# Patient Record
Sex: Male | Born: 1935 | Race: White | Hispanic: No | Smoking: Never smoker
Health system: Southern US, Community
[De-identification: ages and names within clinical notes are randomized; demographics above are authoritative.]

## PROBLEM LIST (undated history)

## (undated) DIAGNOSIS — F329 Major depressive disorder, single episode, unspecified: Secondary | ICD-10-CM

## (undated) DIAGNOSIS — G20 Parkinson's disease: Secondary | ICD-10-CM

## (undated) DIAGNOSIS — F419 Anxiety disorder, unspecified: Secondary | ICD-10-CM

## (undated) DIAGNOSIS — G309 Alzheimer's disease, unspecified: Secondary | ICD-10-CM

## (undated) DIAGNOSIS — Z8546 Personal history of malignant neoplasm of prostate: Secondary | ICD-10-CM

## (undated) DIAGNOSIS — I10 Essential (primary) hypertension: Secondary | ICD-10-CM

## (undated) DIAGNOSIS — I479 Paroxysmal tachycardia, unspecified: Secondary | ICD-10-CM

## (undated) DIAGNOSIS — C801 Malignant (primary) neoplasm, unspecified: Secondary | ICD-10-CM

## (undated) DIAGNOSIS — D649 Anemia, unspecified: Secondary | ICD-10-CM

## (undated) DIAGNOSIS — N400 Enlarged prostate without lower urinary tract symptoms: Secondary | ICD-10-CM

## (undated) DIAGNOSIS — E46 Unspecified protein-calorie malnutrition: Secondary | ICD-10-CM

## (undated) DIAGNOSIS — K219 Gastro-esophageal reflux disease without esophagitis: Secondary | ICD-10-CM

## (undated) DIAGNOSIS — M1990 Unspecified osteoarthritis, unspecified site: Secondary | ICD-10-CM

## (undated) DIAGNOSIS — N182 Chronic kidney disease, stage 2 (mild): Secondary | ICD-10-CM

## (undated) DIAGNOSIS — I739 Peripheral vascular disease, unspecified: Secondary | ICD-10-CM

## (undated) DIAGNOSIS — G20A1 Parkinson's disease without dyskinesia, without mention of fluctuations: Secondary | ICD-10-CM

## (undated) DIAGNOSIS — F32A Depression, unspecified: Secondary | ICD-10-CM

## (undated) DIAGNOSIS — D6489 Other specified anemias: Secondary | ICD-10-CM

## (undated) DIAGNOSIS — E559 Vitamin D deficiency, unspecified: Secondary | ICD-10-CM

## (undated) DIAGNOSIS — M159 Polyosteoarthritis, unspecified: Secondary | ICD-10-CM

## (undated) DIAGNOSIS — F0280 Dementia in other diseases classified elsewhere without behavioral disturbance: Secondary | ICD-10-CM

## (undated) HISTORY — DX: Alzheimer's disease, unspecified: G30.9

## (undated) HISTORY — DX: Vitamin D deficiency, unspecified: E55.9

## (undated) HISTORY — DX: Essential (primary) hypertension: I10

## (undated) HISTORY — DX: Unspecified protein-calorie malnutrition: E46

## (undated) HISTORY — DX: Depression, unspecified: F32.A

## (undated) HISTORY — DX: Major depressive disorder, single episode, unspecified: F32.9

## (undated) HISTORY — DX: Parkinson's disease: G20

## (undated) HISTORY — DX: Chronic kidney disease, stage 2 (mild): N18.2

## (undated) HISTORY — DX: Paroxysmal tachycardia, unspecified: I47.9

## (undated) HISTORY — DX: Polyosteoarthritis, unspecified: M15.9

## (undated) HISTORY — DX: Personal history of malignant neoplasm of prostate: Z85.46

## (undated) HISTORY — DX: Parkinson's disease without dyskinesia, without mention of fluctuations: G20.A1

## (undated) HISTORY — DX: Anxiety disorder, unspecified: F41.9

## (undated) HISTORY — DX: Anemia, unspecified: D64.9

## (undated) HISTORY — DX: Dementia in other diseases classified elsewhere, unspecified severity, without behavioral disturbance, psychotic disturbance, mood disturbance, and anxiety: F02.80

## (undated) HISTORY — DX: Peripheral vascular disease, unspecified: I73.9

## (undated) HISTORY — PX: ROTATOR CUFF REPAIR: SHX139

## (undated) HISTORY — PX: TOTAL KNEE ARTHROPLASTY: SHX125

## (undated) HISTORY — DX: Other specified anemias: D64.89

## (undated) HISTORY — DX: Benign prostatic hyperplasia without lower urinary tract symptoms: N40.0

## (undated) HISTORY — PX: HERNIA REPAIR: SHX51

## (undated) HISTORY — DX: Gastro-esophageal reflux disease without esophagitis: K21.9

## (undated) HISTORY — DX: Unspecified osteoarthritis, unspecified site: M19.90

## (undated) HISTORY — DX: Malignant (primary) neoplasm, unspecified: C80.1

---

## 2003-08-03 ENCOUNTER — Other Ambulatory Visit: Payer: Self-pay

## 2008-05-04 ENCOUNTER — Inpatient Hospital Stay: Payer: Self-pay | Admitting: Internal Medicine

## 2008-05-09 ENCOUNTER — Emergency Department: Payer: Self-pay | Admitting: Internal Medicine

## 2009-03-16 ENCOUNTER — Ambulatory Visit: Payer: Self-pay | Admitting: Internal Medicine

## 2009-04-02 ENCOUNTER — Inpatient Hospital Stay: Payer: Self-pay | Admitting: Specialist

## 2009-04-16 ENCOUNTER — Ambulatory Visit: Payer: Self-pay | Admitting: Internal Medicine

## 2009-10-15 ENCOUNTER — Encounter: Admission: RE | Admit: 2009-10-15 | Discharge: 2009-10-15 | Payer: Self-pay | Admitting: Neurology

## 2010-02-13 ENCOUNTER — Emergency Department: Payer: Self-pay | Admitting: Emergency Medicine

## 2010-09-24 IMAGING — CR DXR PORTABLE CHEST SINGLE VIEW
1 series · 1 of 1 positions shown · non-contrast
Comparison: none

REASON FOR EXAM: intubation
COMMENTS:

PROCEDURE:     DXR - DXR PORTABLE CHEST SINGLE VIEW  - April 04, 2009  [DATE]
RESULT:     Mild bibasilar atelectasis is present. Atelectatic changes
and/or infiltrate in the right lung base has cleared. There is slight
increase in markings in the left lung base from prior exam of 04/03/2009.

[view not recorded]
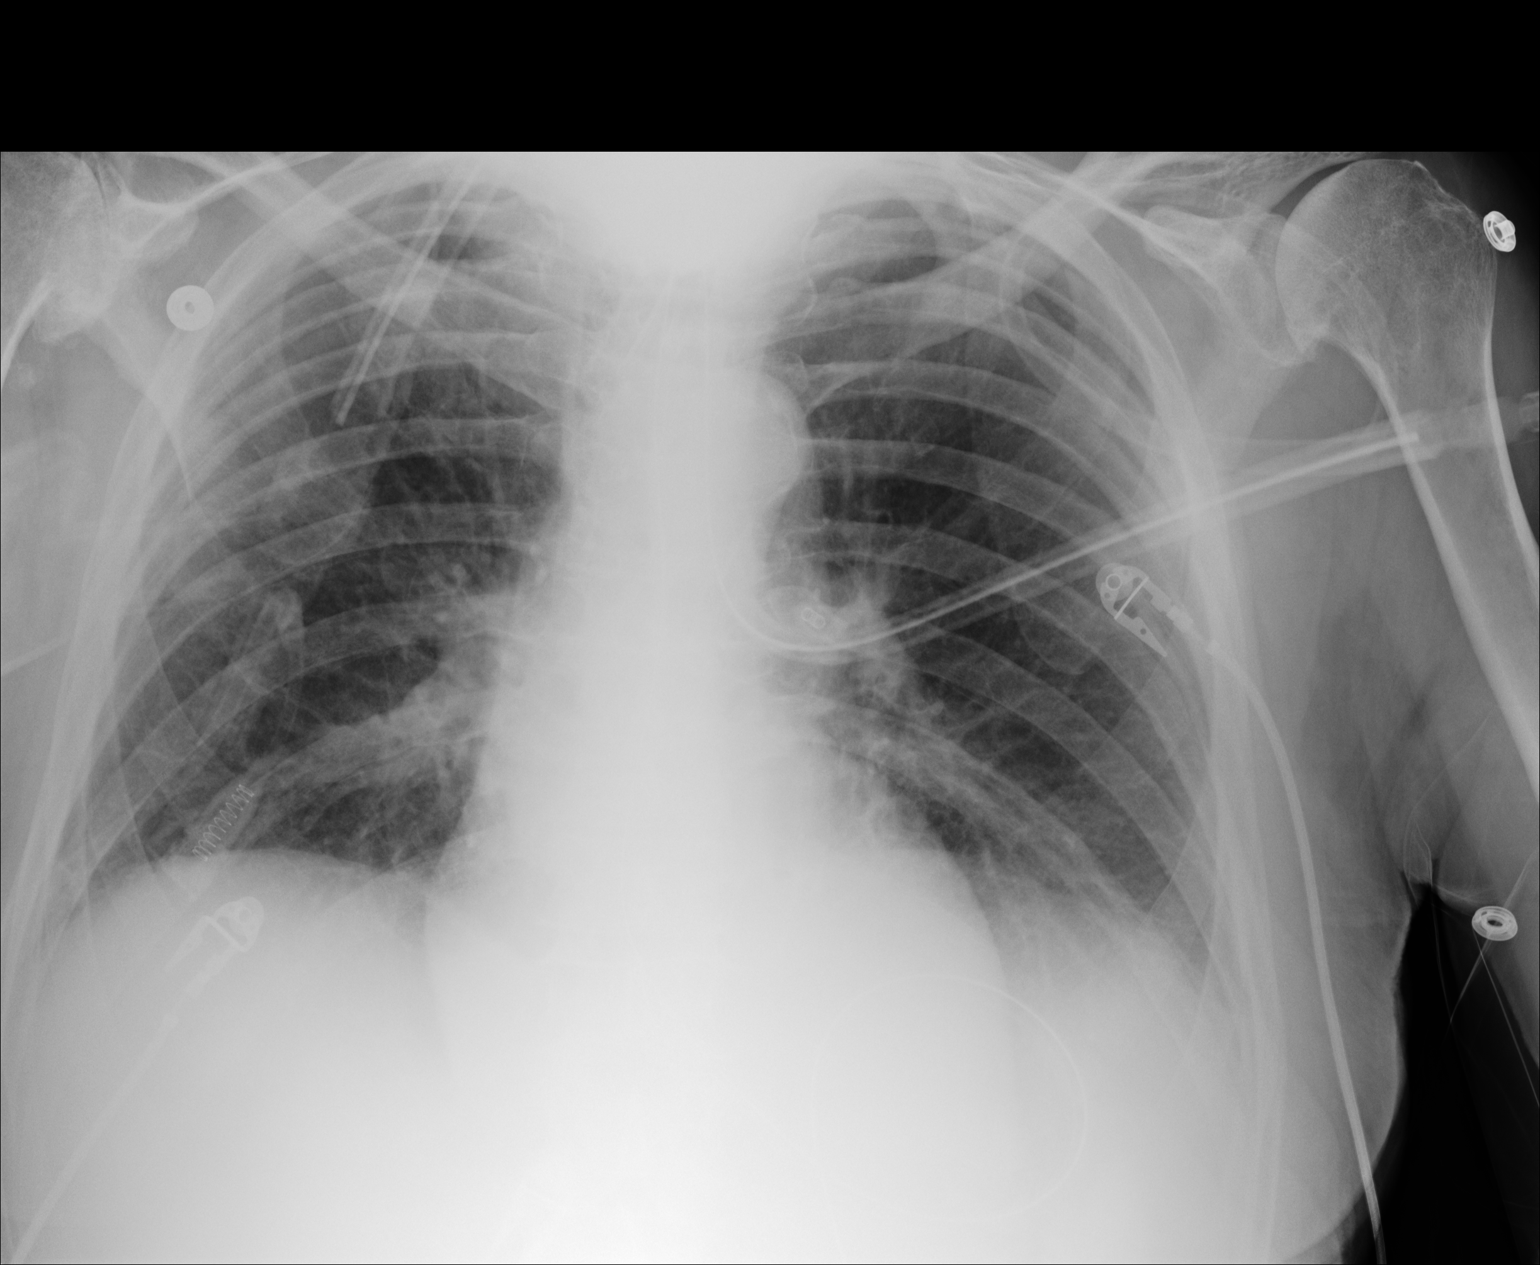

[1 of 1 positions shown; findings below may reference images not displayed]

IMPRESSION: Slight improvement of atelectasis and/or infiltrate in the right lung base.
Mild increase lung markings in the left lung base. This could represent
atelectasis and/or pneumonia. Continued followup chest x-ray is suggested.

## 2011-06-04 ENCOUNTER — Emergency Department: Payer: Self-pay | Admitting: Emergency Medicine

## 2012-06-17 DIAGNOSIS — F411 Generalized anxiety disorder: Secondary | ICD-10-CM

## 2012-06-17 DIAGNOSIS — L0390 Cellulitis, unspecified: Secondary | ICD-10-CM

## 2012-06-17 DIAGNOSIS — Z9181 History of falling: Secondary | ICD-10-CM

## 2012-06-17 DIAGNOSIS — M159 Polyosteoarthritis, unspecified: Secondary | ICD-10-CM

## 2012-06-17 DIAGNOSIS — G309 Alzheimer's disease, unspecified: Secondary | ICD-10-CM

## 2012-06-17 DIAGNOSIS — L0291 Cutaneous abscess, unspecified: Secondary | ICD-10-CM

## 2012-06-17 DIAGNOSIS — F0280 Dementia in other diseases classified elsewhere without behavioral disturbance: Secondary | ICD-10-CM

## 2012-06-17 DIAGNOSIS — H02049 Spastic entropion of unspecified eye, unspecified eyelid: Secondary | ICD-10-CM

## 2012-06-20 ENCOUNTER — Other Ambulatory Visit: Payer: Self-pay | Admitting: *Deleted

## 2012-06-20 MED ORDER — ALPRAZOLAM 0.5 MG PO TBDP: ORAL_TABLET | ORAL | Status: DC

## 2012-06-20 MED ORDER — LORAZEPAM 0.5 MG PO TABS: ORAL_TABLET | ORAL | Status: DC

## 2012-06-21 ENCOUNTER — Other Ambulatory Visit: Payer: Self-pay | Admitting: *Deleted

## 2012-06-21 MED ORDER — ALPRAZOLAM 0.5 MG PO TABS: ORAL_TABLET | ORAL | Status: DC

## 2012-07-04 ENCOUNTER — Other Ambulatory Visit: Payer: Self-pay | Admitting: *Deleted

## 2012-07-04 MED ORDER — ALPRAZOLAM 0.5 MG PO TBDP: ORAL_TABLET | ORAL | Status: DC

## 2012-07-12 ENCOUNTER — Other Ambulatory Visit: Payer: Self-pay | Admitting: *Deleted

## 2012-07-12 MED ORDER — DIAZEPAM 5 MG PO TABS: ORAL_TABLET | ORAL | Status: DC

## 2012-07-15 DIAGNOSIS — E46 Unspecified protein-calorie malnutrition: Secondary | ICD-10-CM

## 2012-07-15 DIAGNOSIS — L03818 Cellulitis of other sites: Secondary | ICD-10-CM

## 2012-07-15 DIAGNOSIS — M6289 Other specified disorders of muscle: Secondary | ICD-10-CM

## 2012-07-15 DIAGNOSIS — I872 Venous insufficiency (chronic) (peripheral): Secondary | ICD-10-CM

## 2012-07-15 DIAGNOSIS — L02818 Cutaneous abscess of other sites: Secondary | ICD-10-CM

## 2012-07-15 DIAGNOSIS — H02049 Spastic entropion of unspecified eye, unspecified eyelid: Secondary | ICD-10-CM

## 2012-07-25 ENCOUNTER — Other Ambulatory Visit: Payer: Self-pay | Admitting: *Deleted

## 2012-07-25 MED ORDER — ALPRAZOLAM 0.5 MG PO TABS: ORAL_TABLET | ORAL | Status: DC

## 2012-08-18 DIAGNOSIS — F339 Major depressive disorder, recurrent, unspecified: Secondary | ICD-10-CM

## 2012-08-18 DIAGNOSIS — G20 Parkinson's disease: Secondary | ICD-10-CM

## 2012-08-18 DIAGNOSIS — E46 Unspecified protein-calorie malnutrition: Secondary | ICD-10-CM

## 2012-09-15 ENCOUNTER — Other Ambulatory Visit: Payer: Self-pay | Admitting: Geriatric Medicine

## 2012-09-15 MED ORDER — ALPRAZOLAM 0.5 MG PO TABS: ORAL_TABLET | ORAL | Status: DC

## 2012-09-22 ENCOUNTER — Encounter: Payer: Self-pay | Admitting: *Deleted

## 2012-10-05 ENCOUNTER — Other Ambulatory Visit: Payer: Self-pay | Admitting: Geriatric Medicine

## 2012-10-05 MED ORDER — ALPRAZOLAM 0.5 MG PO TABS: ORAL_TABLET | ORAL | Status: DC

## 2012-10-06 ENCOUNTER — Other Ambulatory Visit: Payer: Self-pay | Admitting: *Deleted

## 2012-10-06 MED ORDER — ALPRAZOLAM 0.5 MG PO TABS: ORAL_TABLET | ORAL | Status: DC

## 2012-10-07 ENCOUNTER — Non-Acute Institutional Stay (SKILLED_NURSING_FACILITY): Payer: PRIVATE HEALTH INSURANCE | Admitting: Internal Medicine

## 2012-10-07 DIAGNOSIS — F0280 Dementia in other diseases classified elsewhere without behavioral disturbance: Secondary | ICD-10-CM

## 2012-10-07 DIAGNOSIS — F411 Generalized anxiety disorder: Secondary | ICD-10-CM

## 2012-10-07 DIAGNOSIS — G309 Alzheimer's disease, unspecified: Secondary | ICD-10-CM

## 2012-10-07 DIAGNOSIS — F32A Depression, unspecified: Secondary | ICD-10-CM

## 2012-10-07 DIAGNOSIS — K219 Gastro-esophageal reflux disease without esophagitis: Secondary | ICD-10-CM

## 2012-10-07 DIAGNOSIS — F419 Anxiety disorder, unspecified: Secondary | ICD-10-CM | POA: Insufficient documentation

## 2012-10-07 DIAGNOSIS — F329 Major depressive disorder, single episode, unspecified: Secondary | ICD-10-CM

## 2012-10-07 DIAGNOSIS — I10 Essential (primary) hypertension: Secondary | ICD-10-CM

## 2012-10-07 DIAGNOSIS — K560 Paralytic ileus: Secondary | ICD-10-CM

## 2012-10-07 NOTE — Progress Notes (Signed)
Patient ID: Jake Armstrong, male   DOB: Nov 03, 1922, 79 y.o.   MRN: [id number]  Code Status: DNR  Allergies  Allergen Reactions  . Finasteride   . Hytrin (Terazosin)   . Tagamet (Cimetidine)    Chief Complaint  Patient presents with  . Medical Managment of Chronic Issues    HPI:  79 Y/o male patient seen today for routine follow up. He is in his room and in no distress. He complaints of abdominal pain. As per staff he has distended and tender abdomen. No vomiting. Did not have his breakfast this am due to abdominal discomfort. Is passing gas and as per chart review, had a bowel movement last night No other complaints Of note he has had similar problem in past and had ileus  Review of Systems:  No fever or chills No chest pain or palpitations No SIB, cough  In a wheelchair Has dementia  Past Medical History  Diagnosis Date  . Anxiety   . Depression   . GERD (gastroesophageal reflux disease)   . Hypertension   . Cancer     prostate  . Arthritis     osteo  . Anemia   . Parkinson's disease   . Alzheimer's disease   . Hypertrophy of prostate without urinary obstruction and other lower urinary tract symptoms (LUTS)   . Generalized osteoarthrosis, involving multiple sites   . Esophageal reflux   . Other specified anemias   . Unspecified vitamin D deficiency   . Peripheral vascular disease, unspecified   . Paralysis agitans   . Unspecified protein-calorie malnutrition   . Chronic kidney disease, stage II (mild)   . Paroxysmal tachycardia, unspecified   . Personal history of malignant neoplasm of prostate    Past Surgical History  Procedure Laterality Date  . Total knee arthroplasty Bilateral   . Rotator cuff repair    . Hernia repair      INGUINAL   Social History:   has no tobacco, alcohol, and drug history on file.  No family history on file.  Medications: Patient's Medications  New Prescriptions   No medications on file  Previous Medications   ACETAMINOPHEN  (TYLENOL) 325 MG TABLET    Take 650 mg by mouth every 8 (eight) hours.   ALPRAZOLAM (XANAX) 0.5 MG TABLET    Take one tablet by mouth three times daily for anxiety. Hold for sedation   AMLODIPINE (NORVASC) 10 MG TABLET    Take 10 mg by mouth daily.   CHOLECALCIFEROL (VITAMIN D3) 2000 UNITS TABS    Take 1 capsule by mouth daily.   DULOXETINE (CYMBALTA) 60 MG CAPSULE    Take 60 mg by mouth daily.   HYDROCODONE-ACETAMINOPHEN (NORCO/VICODIN) 5-325 MG PER TABLET    Take 1 tablet by mouth every 8 (eight) hours as needed for pain.   IRON POLYSACCHARIDES (NIFEREX) 150 MG CAPSULE    Take 150 mg by mouth 2 (two) times daily.   LABETALOL (NORMODYNE) 100 MG TABLET    Take 100 mg by mouth 2 (two) times daily.   LAMOTRIGINE (LAMICTAL) 100 MG TABLET    Take 100 mg by mouth 2 (two) times daily.   LIDOCAINE (LIDODERM) 5 %    Place 1 patch onto the skin daily. Remove & Discard patch within 12 hours or as directed by MD   MELATONIN 3 MG CAPS    Take 1 capsule by mouth daily.   MEMANTINE HCL ER (NAMENDA XR) 28 MG CP24    Take 1 capsule  by mouth daily.   MIRTAZAPINE (REMERON) 7.5 MG TABLET    Take 7.5 mg by mouth at bedtime.   OMEPRAZOLE (PRILOSEC) 40 MG CAPSULE    Take 40 mg by mouth daily.   POTASSIUM CHLORIDE (KCL-20 PO)    Take 1 capsule by mouth daily.   PROPYLENE GLYCOL (SYSTANE BALANCE) 0.6 % SOLN    Apply 1 drop to eye 2 (two) times daily.   SENNOSIDES-DOCUSATE SODIUM (SENOKOT-S) 8.6-50 MG TABLET    Take 2 tablets by mouth daily.   TAMSULOSIN (FLOMAX) 0.4 MG CAPS    Take 0.4 mg by mouth daily.   VITAMIN B-12 (CYANOCOBALAMIN) 1000 MCG TABLET    Take 1,000 mcg by mouth daily.  Modified Medications   No medications on file  Discontinued Medications   ALPRAZOLAM (NIRAVAM) 0.5 MG DISSOLVABLE TABLET    Take one tablet by mouth four times daily for anxiety. Hold for sedation.   DIAZEPAM (VALIUM) 5 MG TABLET    Take 1 tablet by mouth 30 minutes prior to eye surgery scheduled on 07/31/2012 at 2:30 PM, Then take 2  tablets to appointment     Filed Vitals:   10/07/12 1247  BP: 130/65  Pulse: 76  Temp: 98 F (36.7 C)  Resp: 18  Weight: 147 lb (66.679 kg)  SpO2: 96%   Physical Exam  Constitutional:  Elderly, frail, baseline confusion, repeats a phrase/ question several times, in NAD  HENT:  Head: Normocephalic.  Mouth/Throat: Oropharynx is clear and moist.  Eyes: Conjunctivae are normal. Pupils are equal, round, and reactive to light.  Neck: Normal range of motion. Neck supple.  Cardiovascular: Normal rate and regular rhythm.   Pulmonary/Chest: Effort normal and breath sounds normal.  Abdominal:  Distended, tender on palpation, hypoactive bowel sounds, no guarding, no rigidity, no mass  Musculoskeletal: He exhibits no edema.  Propels his wheelchiar, contractures in both knees, hands and elbows but able to move all 4  Neurological: He is alert.  Skin: Skin is warm and dry.  Psychiatric: He has a normal mood and affect.  Poor insight, ocassionally has mood swings and shows compulsive behavior    Labs and medications reviewed  Assessment/Plan  Abdominal pain- with distension and tenderness ad has hypoactive bowel sounds. Concerns for ileus. On iron supplement and norco. Will d/c them for now. Get KUB to rule out obstruction. Will consider enema if has fecal impaction on xray. Will have him switched to liquid diet until the pain subsides. If no imporvement, will consider further imaging and management. Monitor for fever, vomiting. Is able to pass gas at present  Anxiety- continue alprazolam for now  HTN- bp stable. Continue current regimen. No changes made  Depression- continue cymbalta and lamictal with remeron  alzhimer's dementia- continue namenda  BPH- tolerating flomax well. Continue this  gerd- continue ppi, monitor  Insomnia- continue melatonin for now

## 2012-11-11 ENCOUNTER — Other Ambulatory Visit: Payer: Self-pay | Admitting: *Deleted

## 2012-11-11 MED ORDER — ALPRAZOLAM 0.5 MG PO TABS: ORAL_TABLET | ORAL | Status: DC

## 2012-11-20 ENCOUNTER — Emergency Department (HOSPITAL_COMMUNITY): Payer: Medicare Other

## 2012-11-20 ENCOUNTER — Emergency Department (HOSPITAL_COMMUNITY)
Admission: EM | Admit: 2012-11-20 | Discharge: 2012-11-20 | Disposition: A | Discharge status: Home / Self Care | Payer: Medicare Other | Attending: Emergency Medicine | Admitting: Emergency Medicine

## 2012-11-20 ENCOUNTER — Encounter (HOSPITAL_COMMUNITY): Payer: Self-pay | Admitting: Emergency Medicine

## 2012-11-20 DIAGNOSIS — W19XXXA Unspecified fall, initial encounter: Secondary | ICD-10-CM

## 2012-11-20 DIAGNOSIS — S01309A Unspecified open wound of unspecified ear, initial encounter: Secondary | ICD-10-CM | POA: Insufficient documentation

## 2012-11-20 DIAGNOSIS — G309 Alzheimer's disease, unspecified: Secondary | ICD-10-CM | POA: Insufficient documentation

## 2012-11-20 DIAGNOSIS — Y9289 Other specified places as the place of occurrence of the external cause: Secondary | ICD-10-CM | POA: Insufficient documentation

## 2012-11-20 DIAGNOSIS — F0280 Dementia in other diseases classified elsewhere without behavioral disturbance: Secondary | ICD-10-CM | POA: Insufficient documentation

## 2012-11-20 DIAGNOSIS — D6489 Other specified anemias: Secondary | ICD-10-CM | POA: Insufficient documentation

## 2012-11-20 DIAGNOSIS — N400 Enlarged prostate without lower urinary tract symptoms: Secondary | ICD-10-CM | POA: Insufficient documentation

## 2012-11-20 DIAGNOSIS — Z79899 Other long term (current) drug therapy: Secondary | ICD-10-CM | POA: Insufficient documentation

## 2012-11-20 DIAGNOSIS — M1990 Unspecified osteoarthritis, unspecified site: Secondary | ICD-10-CM | POA: Insufficient documentation

## 2012-11-20 DIAGNOSIS — I129 Hypertensive chronic kidney disease with stage 1 through stage 4 chronic kidney disease, or unspecified chronic kidney disease: Secondary | ICD-10-CM | POA: Insufficient documentation

## 2012-11-20 DIAGNOSIS — Z96659 Presence of unspecified artificial knee joint: Secondary | ICD-10-CM | POA: Insufficient documentation

## 2012-11-20 DIAGNOSIS — M129 Arthropathy, unspecified: Secondary | ICD-10-CM | POA: Insufficient documentation

## 2012-11-20 DIAGNOSIS — W1809XA Striking against other object with subsequent fall, initial encounter: Secondary | ICD-10-CM | POA: Insufficient documentation

## 2012-11-20 DIAGNOSIS — F411 Generalized anxiety disorder: Secondary | ICD-10-CM | POA: Insufficient documentation

## 2012-11-20 DIAGNOSIS — F329 Major depressive disorder, single episode, unspecified: Secondary | ICD-10-CM | POA: Insufficient documentation

## 2012-11-20 DIAGNOSIS — Y9389 Activity, other specified: Secondary | ICD-10-CM | POA: Insufficient documentation

## 2012-11-20 DIAGNOSIS — S01312A Laceration without foreign body of left ear, initial encounter: Secondary | ICD-10-CM

## 2012-11-20 DIAGNOSIS — K219 Gastro-esophageal reflux disease without esophagitis: Secondary | ICD-10-CM | POA: Insufficient documentation

## 2012-11-20 DIAGNOSIS — Z8546 Personal history of malignant neoplasm of prostate: Secondary | ICD-10-CM | POA: Insufficient documentation

## 2012-11-20 DIAGNOSIS — N182 Chronic kidney disease, stage 2 (mild): Secondary | ICD-10-CM | POA: Insufficient documentation

## 2012-11-20 DIAGNOSIS — F3289 Other specified depressive episodes: Secondary | ICD-10-CM | POA: Insufficient documentation

## 2012-11-20 DIAGNOSIS — E559 Vitamin D deficiency, unspecified: Secondary | ICD-10-CM | POA: Insufficient documentation

## 2012-11-20 MED ORDER — CEPHALEXIN 250 MG PO CAPS: 250.0000 mg | ORAL_CAPSULE | Freq: Four times a day (QID) | ORAL | Status: DC

## 2012-11-20 MED ORDER — CEPHALEXIN 250 MG PO CAPS
250.0000 mg | ORAL_CAPSULE | Freq: Once | ORAL | Status: AC
  Administered 2012-11-20: 250 mg via ORAL
  Filled 2012-11-20: qty 1

## 2012-11-20 NOTE — Discharge Instructions (Signed)
Laceration Care, Adult °A laceration is a cut or lesion that goes through all layers of the skin and into the tissue just beneath the skin. °TREATMENT  °Some lacerations may not require closure. Some lacerations may not be able to be closed due to an increased risk of infection. It is important to see your caregiver as soon as possible after an injury to minimize the risk of infection and maximize the opportunity for successful closure. °If closure is appropriate, pain medicines may be given, if needed. The wound will be cleaned to help prevent infection. Your caregiver will use stitches (sutures), staples, wound glue (adhesive), or skin adhesive strips to repair the laceration. These tools bring the skin edges together to allow for faster healing and a better cosmetic outcome. However, all wounds will heal with a scar. Once the wound has healed, scarring can be minimized by covering the wound with sunscreen during the day for 1 full year. °HOME CARE INSTRUCTIONS  °For sutures or staples: °· Keep the wound clean and dry. °· If you were given a bandage (dressing), you should change it at least once a day. Also, change the dressing if it becomes wet or dirty, or as directed by your caregiver. °· Wash the wound with soap and water 2 times a day. Rinse the wound off with water to remove all soap. Pat the wound dry with a clean towel. °· After cleaning, apply a thin layer of the antibiotic ointment as recommended by your caregiver. This will help prevent infection and keep the dressing from sticking. °· You may shower as usual after the first 24 hours. Do not soak the wound in water until the sutures are removed. °· Only take over-the-counter or prescription medicines for pain, discomfort, or fever as directed by your caregiver. °· Get your sutures or staples removed as directed by your caregiver. °For skin adhesive strips: °· Keep the wound clean and dry. °· Do not get the skin adhesive strips wet. You may bathe  carefully, using caution to keep the wound dry. °· If the wound gets wet, pat it dry with a clean towel. °· Skin adhesive strips will fall off on their own. You may trim the strips as the wound heals. Do not remove skin adhesive strips that are still stuck to the wound. They will fall off in time. °For wound adhesive: °· You may briefly wet your wound in the shower or bath. Do not soak or scrub the wound. Do not swim. Avoid periods of heavy perspiration until the skin adhesive has fallen off on its own. After showering or bathing, gently pat the wound dry with a clean towel. °· Do not apply liquid medicine, cream medicine, or ointment medicine to your wound while the skin adhesive is in place. This may loosen the film before your wound is healed. °· If a dressing is placed over the wound, be careful not to apply tape directly over the skin adhesive. This may cause the adhesive to be pulled off before the wound is healed. °· Avoid prolonged exposure to sunlight or tanning lamps while the skin adhesive is in place. Exposure to ultraviolet light in the first year will darken the scar. °· The skin adhesive will usually remain in place for 5 to 10 days, then naturally fall off the skin. Do not pick at the adhesive film. °You may need a tetanus shot if: °· You cannot remember when you had your last tetanus shot. °· You have never had a tetanus   shot. If you get a tetanus shot, your arm may swell, get red, and feel warm to the touch. This is common and not a problem. If you need a tetanus shot and you choose not to have one, there is a rare chance of getting tetanus. Sickness from tetanus can be serious. SEEK MEDICAL CARE IF:   You have redness, swelling, or increasing pain in the wound.  You see a red line that goes away from the wound.  You have yellowish-white fluid (pus) coming from the wound.  You have a fever.  You notice a bad smell coming from the wound or dressing.  Your wound breaks open before or  after sutures have been removed.  You notice something coming out of the wound such as wood or glass.  Your wound is on your hand or foot and you cannot move a finger or toe. SEEK IMMEDIATE MEDICAL CARE IF:   Your pain is not controlled with prescribed medicine.  You have severe swelling around the wound causing pain and numbness or a change in color in your arm, hand, leg, or foot.  Your wound splits open and starts bleeding.  You have worsening numbness, weakness, or loss of function of any joint around or beyond the wound.  You develop painful lumps near the wound or on the skin anywhere on your body. MAKE SURE YOU:   Understand these instructions.  Will watch your condition.  Will get help right away if you are not doing well or get worse. Document Released: 03/02/2005 Document Revised: 05/25/2011 Document Reviewed: 08/26/2010 [facility] Patient Information 2014 [place], [place]. Please make an appointment with Dr. Barbaraann Share office to be seen in one week.  U. been given a prescription for antibiotic.  Please take this as instructed until completed

## 2012-11-20 NOTE — Consult Note (Signed)
Reason for Consult: Ear laceration Referring Physician: Teressa Lower, NP  HPI:  Jake Armstrong is an 79 y.o. male who was transported to the [facility] emergency room by EMS. The patient has a history of Parkinson's and Alzheimer's disease. According to the EMS report, the patient was sitting on the toilet, and fell off the toilet, hitting his head. No loss of consciousness was reported. The patient was noted to have a significant laceration on his left ear. ENT is therefore consult for further evaluation and treatment.  Past Medical History  Diagnosis Date  . Anxiety   . Depression   . GERD (gastroesophageal reflux disease)   . Hypertension   . Cancer     prostate  . Arthritis     osteo  . Anemia   . Parkinson's disease   . Alzheimer's disease   . Hypertrophy of prostate without urinary obstruction and other lower urinary tract symptoms (LUTS)   . Generalized osteoarthrosis, involving multiple sites   . Esophageal reflux   . Other specified anemias   . Unspecified vitamin D deficiency   . Peripheral vascular disease, unspecified   . Paralysis agitans   . Unspecified protein-calorie malnutrition   . Chronic kidney disease, stage II (mild)   . Paroxysmal tachycardia, unspecified   . Personal history of malignant neoplasm of prostate     Past Surgical History  Procedure Laterality Date  . Total knee arthroplasty Bilateral   . Rotator cuff repair    . Hernia repair      INGUINAL    No family history on file.  Social History:  reports that he has never smoked. He does not have any smokeless tobacco history on file. His alcohol and drug histories are not on file.  Allergies:  Allergies  Allergen Reactions  . Finasteride   . Hytrin [Terazosin]   . Tagamet [Cimetidine]     Medications: I have reviewed the patient's current medications. Prior to Admission:  Home Medications    Current Outpatient Rx   Name   Route   Sig   Dispense   Refill   .  acetaminophen  (TYLENOL) 325 MG tablet   Oral   Take 650 mg by mouth every 8 (eight) hours.       .  ALPRAZolam (XANAX) 0.5 MG tablet     Take one tablet by mouth three times daily for anxiety. Hold for sedation   90 tablet   5   .  amLODipine (NORVASC) 10 MG tablet   Oral   Take 10 mg by mouth daily.       .  Cholecalciferol (VITAMIN D3) 2000 UNITS TABS   Oral   Take 1 capsule by mouth daily.       .  DULoxetine (CYMBALTA) 60 MG capsule   Oral   Take 60 mg by mouth daily.       Marland Kitchen  HYDROcodone-acetaminophen (NORCO/VICODIN) 5-325 MG per tablet   Oral   Take 1 tablet by mouth every 8 (eight) hours as needed for pain.       .  iron polysaccharides (NIFEREX) 150 MG capsule   Oral   Take 150 mg by mouth 2 (two) times daily.       Marland Kitchen  labetalol (NORMODYNE) 100 MG tablet   Oral   Take 100 mg by mouth 2 (two) times daily.       Marland Kitchen  lamoTRIgine (LAMICTAL) 100 MG tablet   Oral   Take 100 mg by mouth  2 (two) times daily.       Marland Kitchen  lidocaine (LIDODERM) 5 %   Transdermal   Place 1 patch onto the skin daily. Remove & Discard patch within 12 hours or as directed by MD       .  Melatonin 3 MG CAPS   Oral   Take 1 capsule by mouth daily.       .  Memantine HCl ER (NAMENDA XR) 28 MG CP24   Oral   Take 1 capsule by mouth daily.       .  mirtazapine (REMERON) 7.5 MG tablet   Oral   Take 7.5 mg by mouth at bedtime.       Marland Kitchen  omeprazole (PRILOSEC) 40 MG capsule   Oral   Take 40 mg by mouth daily.       .  Potassium Chloride (KCL-20 PO)   Oral   Take 1 capsule by mouth daily.       Marland Kitchen  Propylene Glycol (SYSTANE BALANCE) 0.6 % SOLN   Ophthalmic   Apply 1 drop to eye 2 (two) times daily.       .  sennosides-docusate sodium (SENOKOT-S) 8.6-50 MG tablet   Oral   Take 2 tablets by mouth daily.       .  tamsulosin (FLOMAX) 0.4 MG CAPS   Oral   Take 0.4 mg by mouth daily.       .  vitamin B-12 (CYANOCOBALAMIN) 1000 MCG tablet   Oral   Take 1,000 mcg by mouth daily.          No results found for this or any previous visit (from the past 48  hour(s)).  Dg Pelvis Portable  11/20/2012   CLINICAL DATA:  Fall at nursing home.  EXAM: PORTABLE PELVIS  COMPARISON:  None.  FINDINGS: There is no evidence of pelvic fracture or diastasis. No other pelvic bone lesions are seen.  IMPRESSION: Negative   Electronically Signed   By: Herbie Baltimore   On: 11/20/2012 20:15   Review of Systems  Unable to perform ROS: Dementia   Blood pressure 146/61, pulse 61, temperature 98.3 F (36.8 C), temperature source Oral, resp. rate 16, SpO2 97.00%. Physical Exam  Vitals reviewed.  Constitutional: He appears well-nourished. Confused. Demented. HENT:  Extensive laceration thru the cartilage of the left ear (total length 5cm).    The ear canals and tympanic membranes otherwise normal. No hemotympanum is noted. Nasal and oral cavity examination is within normal limits.  Cardiovascular: Normal rate and regular rhythm.  Pulmonary/Chest: Effort normal and breath sounds normal.  Musculoskeletal:  Pt has tremor:moves all extremities at baseline  Neurological: He is awake but confused.  Skin:  New and old abrasions to all extremities.  Procedure: Complex repair of left ear laceration (5cm) Anesthesia: Local anesthesia with 1% lidocaine with 1:100,000 epinephrine Description: The patient is placed supine on the operating table. The left ear is prepped and draped in a sterile fashion.  After adequate local anesthesia is achieved, the laceration site is carefully debrided.   Extensive soft tissue undermining is performed to release the skin tension. The lacerated cartilage is reapproximated with 5-0 Monocryl sutures. The skin laceration is closed in layers with interrupted 5-0 prolene sutures.  The patient tolerated the procedure well.    Assessment/Plan:  Complex repair of a left auricular laceration.  The laceration is performed under local anesthesia. The patient tolerated the procedure well. The patient will be discharged home on oral antibiotics. He  will  followup at my office in one week for suture removal.   Fatim Vanderschaaf,SUI W 11/20/2012, 8:57 PM

## 2012-11-20 NOTE — ED Provider Notes (Signed)
CSN: [id number]     Arrival date & time 11/20/12  1859 History   First MD Initiated Contact with Patient 11/20/12 1900     Chief Complaint  Patient presents with  . Ear Laceration  . Fall   (Consider location/radiation/quality/duration/timing/severity/associated sxs/prior Treatment) HPI Comments: ems reports that pt was sitting on the toilet and fell off and hit his head:no loc report:pt moving all extremities at baseline per family:family was not at nursing home  The history is provided by the EMS personnel and the nursing home.    Past Medical History  Diagnosis Date  . Anxiety   . Depression   . GERD (gastroesophageal reflux disease)   . Hypertension   . Cancer     prostate  . Arthritis     osteo  . Anemia   . Parkinson's disease   . Alzheimer's disease   . Hypertrophy of prostate without urinary obstruction and other lower urinary tract symptoms (LUTS)   . Generalized osteoarthrosis, involving multiple sites   . Esophageal reflux   . Other specified anemias   . Unspecified vitamin D deficiency   . Peripheral vascular disease, unspecified   . Paralysis agitans   . Unspecified protein-calorie malnutrition   . Chronic kidney disease, stage II (mild)   . Paroxysmal tachycardia, unspecified   . Personal history of malignant neoplasm of prostate    Past Surgical History  Procedure Laterality Date  . Total knee arthroplasty Bilateral   . Rotator cuff repair    . Hernia repair      INGUINAL   No family history on file. History  Substance Use Topics  . Smoking status: Never Smoker   . Smokeless tobacco: Not on file  . Alcohol Use: Not on file    Review of Systems  Unable to perform ROS: Dementia    Allergies  Finasteride; Hytrin; and Tagamet  Home Medications   Current Outpatient Rx  Name  Route  Sig  Dispense  Refill  . acetaminophen (TYLENOL) 325 MG tablet   Oral   Take 650 mg by mouth every 8 (eight) hours.         . ALPRAZolam (XANAX) 0.5 MG  tablet      Take one tablet by mouth three times daily for anxiety. Hold for sedation   90 tablet   5   . amLODipine (NORVASC) 10 MG tablet   Oral   Take 10 mg by mouth daily.         . Cholecalciferol (VITAMIN D3) 2000 UNITS TABS   Oral   Take 1 capsule by mouth daily.         . DULoxetine (CYMBALTA) 60 MG capsule   Oral   Take 60 mg by mouth daily.         Marland Kitchen HYDROcodone-acetaminophen (NORCO/VICODIN) 5-325 MG per tablet   Oral   Take 1 tablet by mouth every 8 (eight) hours as needed for pain.         . iron polysaccharides (NIFEREX) 150 MG capsule   Oral   Take 150 mg by mouth 2 (two) times daily.         Marland Kitchen labetalol (NORMODYNE) 100 MG tablet   Oral   Take 100 mg by mouth 2 (two) times daily.         Marland Kitchen lamoTRIgine (LAMICTAL) 100 MG tablet   Oral   Take 100 mg by mouth 2 (two) times daily.         Marland Kitchen lidocaine (LIDODERM)  5 %   Transdermal   Place 1 patch onto the skin daily. Remove & Discard patch within 12 hours or as directed by MD         . Melatonin 3 MG CAPS   Oral   Take 1 capsule by mouth daily.         . Memantine HCl ER (NAMENDA XR) 28 MG CP24   Oral   Take 1 capsule by mouth daily.         . mirtazapine (REMERON) 7.5 MG tablet   Oral   Take 7.5 mg by mouth at bedtime.         Marland Kitchen omeprazole (PRILOSEC) 40 MG capsule   Oral   Take 40 mg by mouth daily.         . Potassium Chloride (KCL-20 PO)   Oral   Take 1 capsule by mouth daily.         Marland Kitchen Propylene Glycol (SYSTANE BALANCE) 0.6 % SOLN   Ophthalmic   Apply 1 drop to eye 2 (two) times daily.         . sennosides-docusate sodium (SENOKOT-S) 8.6-50 MG tablet   Oral   Take 2 tablets by mouth daily.         . tamsulosin (FLOMAX) 0.4 MG CAPS   Oral   Take 0.4 mg by mouth daily.         . vitamin B-12 (CYANOCOBALAMIN) 1000 MCG tablet   Oral   Take 1,000 mcg by mouth daily.          BP 146/61  Pulse 61  Temp(Src) 98.3 F (36.8 C) (Oral)  Resp 16  SpO2  97% Physical Exam  Vitals reviewed. Constitutional: He appears well-nourished.  HENT:  Extensive laceration thru the cartilage of the left ear  Cardiovascular: Normal rate and regular rhythm.   Pulmonary/Chest: Effort normal and breath sounds normal.  Abdominal: Soft. Bowel sounds are normal. There is no tenderness.  Musculoskeletal:  Pt has tremor:moves all extremities at baseline  Neurological: He is alert.  Skin:  New and old abrasions to all extremities:pt has a laceration to the left ear thru the cartilage    ED Course  Procedures (including critical care time) Labs Review Labs Reviewed - No data to display Imaging Review No results found.  MDM   1. Ear lobe laceration, left, initial encounter   2. Fall, initial encounter    Dr. Suszanne Conners to come in and repair the ear:pt ct and x-ray pending:Gail Tomasa Blase NP to follow pt    Teressa Lower, NP 11/20/12 2008

## 2012-11-20 NOTE — ED Provider Notes (Signed)
Medical screening examination/treatment/procedure(s) were conducted as a shared visit with non-physician practitioner(s) and myself.  I personally evaluated the patient during the encounter  Patient seen by me. Patient from nursing home baseline dementia had falloff call at head injury significant laceration to the left and including into the cartilage. Will consult maxillofacial trauma for repair of that we'll do a head CT to rule out any significant head injury and does get bilateral pelvic hip films to rule out hip fracture. Patient does have a bruise on his chin has some old bruising on his lower legs. Does have some bruising on his left hand.     Shelda Jakes, MD 11/20/12 [phone]

## 2012-11-20 NOTE — ED Provider Notes (Signed)
Dr. Seth Bake has been to the bedside preparing a laceration to the earlobe.  She would like to see the patient.  In one week.  He requests that he be given a prescription for Keflex. X-rays were reviewed.  There are no fractures  Arman Filter, NP 11/20/12 2137 At time of discharge.  It was discovered that patient had a skin tear to his left elbow.  This was cleaned, Steri-Stripped, and dressed  Arman Filter, NP 11/20/12 2215  Arman Filter, NP 11/21/12 [phone]

## 2012-11-20 NOTE — ED Notes (Signed)
Pt here via ptar for eval of ear lac.  Per facility staff, pt was sitting on toilet and fell sideways, tearing his upper earlobe. Hx dementia and parkinsons.

## 2012-11-22 NOTE — ED Provider Notes (Signed)
Medical screening examination/treatment/procedure(s) were conducted as a shared visit with non-physician practitioner(s) and myself.  I personally evaluated the patient during the encounter  See my previous note.  Shelda Jakes, MD 11/22/12 1153

## 2012-11-24 ENCOUNTER — Non-Acute Institutional Stay (SKILLED_NURSING_FACILITY): Payer: PRIVATE HEALTH INSURANCE | Admitting: Internal Medicine

## 2012-11-24 ENCOUNTER — Encounter: Payer: Self-pay | Admitting: Internal Medicine

## 2012-11-24 DIAGNOSIS — F0280 Dementia in other diseases classified elsewhere without behavioral disturbance: Secondary | ICD-10-CM

## 2012-11-24 DIAGNOSIS — F329 Major depressive disorder, single episode, unspecified: Secondary | ICD-10-CM

## 2012-11-24 DIAGNOSIS — F419 Anxiety disorder, unspecified: Secondary | ICD-10-CM

## 2012-11-24 DIAGNOSIS — K5900 Constipation, unspecified: Secondary | ICD-10-CM | POA: Insufficient documentation

## 2012-11-24 DIAGNOSIS — S01312A Laceration without foreign body of left ear, initial encounter: Secondary | ICD-10-CM | POA: Insufficient documentation

## 2012-11-24 DIAGNOSIS — F411 Generalized anxiety disorder: Secondary | ICD-10-CM

## 2012-11-24 DIAGNOSIS — E538 Deficiency of other specified B group vitamins: Secondary | ICD-10-CM

## 2012-11-24 DIAGNOSIS — I10 Essential (primary) hypertension: Secondary | ICD-10-CM

## 2012-11-24 DIAGNOSIS — N400 Enlarged prostate without lower urinary tract symptoms: Secondary | ICD-10-CM

## 2012-11-24 DIAGNOSIS — S01309A Unspecified open wound of unspecified ear, initial encounter: Secondary | ICD-10-CM

## 2012-11-24 DIAGNOSIS — K219 Gastro-esophageal reflux disease without esophagitis: Secondary | ICD-10-CM

## 2012-11-24 DIAGNOSIS — E876 Hypokalemia: Secondary | ICD-10-CM | POA: Insufficient documentation

## 2012-11-24 NOTE — Progress Notes (Signed)
Patient ID: Jake Armstrong, male   DOB: 1922/12/10, 79 y.o.   MRN: [id number]  Chief Complaint  Patient presents with  . Medical Managment of Chronic Issues    annual exam   [facility]  Allergies  Allergen Reactions  . Finasteride   . Hytrin [Terazosin]   . Tagamet [Cimetidine]    Code status- DNR  HPI 79 y/o male patient is a long term care resident. He had a fall few days back and had left ear laceration. He had sutures applied and is on antibiotics. He is in his room and in no distress. He has been having better bowel movement now, last bowel movement was 2 days back. He denies any abdominal discomfort.   Review of Systems:  No fever or chills No chest pain or palpitations or dyspnea No headache wears corrective lenses Has dementia and propels in wheelchair Appetite is good  Past Medical History  Diagnosis Date  . Anxiety   . Depression   . GERD (gastroesophageal reflux disease)   . Hypertension   . Cancer     prostate  . Arthritis     osteo  . Anemia   . Parkinson's disease   . Alzheimer's disease   . Hypertrophy of prostate without urinary obstruction and other lower urinary tract symptoms (LUTS)   . Generalized osteoarthrosis, involving multiple sites   . Esophageal reflux   . Other specified anemias   . Unspecified vitamin D deficiency   . Peripheral vascular disease, unspecified   . Paralysis agitans   . Unspecified protein-calorie malnutrition   . Chronic kidney disease, stage II (mild)   . Paroxysmal tachycardia, unspecified   . Personal history of malignant neoplasm of prostate    Past Surgical History  Procedure Laterality Date  . Total knee arthroplasty Bilateral   . Rotator cuff repair    . Hernia repair      INGUINAL   Current Outpatient Prescriptions on File Prior to Visit  Medication Sig Dispense Refill  . acetaminophen (TYLENOL) 325 MG tablet Take 650 mg by mouth every 8 (eight) hours.      . ALPRAZolam (XANAX) 0.5 MG tablet Take  one tablet by mouth three times daily for anxiety. Hold for sedation  90 tablet  5  . amLODipine (NORVASC) 10 MG tablet Take 10 mg by mouth daily.      . cephALEXin (KEFLEX) 250 MG capsule Take 1 capsule (250 mg total) by mouth 4 (four) times daily.  28 capsule  0  . Cholecalciferol (VITAMIN D3) 2000 UNITS TABS Take 1 capsule by mouth daily.      Marland Kitchen donepezil (ARICEPT) 10 MG tablet Take 10 mg by mouth at bedtime.      . DULoxetine (CYMBALTA) 60 MG capsule Take 60 mg by mouth daily.      Marland Kitchen labetalol (NORMODYNE) 100 MG tablet Take 100 mg by mouth 2 (two) times daily.      Marland Kitchen lamoTRIgine (LAMICTAL) 100 MG tablet Take 100 mg by mouth 2 (two) times daily.      Marland Kitchen lidocaine (LIDODERM) 5 % Place 1 patch onto the skin daily. Remove & Discard patch within 12 hours or as directed by MD      . mirtazapine (REMERON) 7.5 MG tablet Take 7.5 mg by mouth at bedtime.      Marland Kitchen omeprazole (PRILOSEC) 40 MG capsule Take 40 mg by mouth daily.      . Potassium Chloride (KCL-20 PO) Take 1 capsule by mouth  daily.      . Propylene Glycol (SYSTANE BALANCE) 0.6 % SOLN Apply 1 drop to eye 2 (two) times daily.      . sennosides-docusate sodium (SENOKOT-S) 8.6-50 MG tablet Take 2 tablets by mouth daily.      . tamsulosin (FLOMAX) 0.4 MG CAPS Take 0.4 mg by mouth daily.      . vitamin B-12 (CYANOCOBALAMIN) 1000 MCG tablet Take 1,000 mcg by mouth daily.       No current facility-administered medications on file prior to visit.    Physical exam  BP 140/70  Pulse 65  Temp(Src) 97 F (36.1 C)  Resp 18  SpO2 98%  Constitutional:  Elderly, frail, baseline confusion, repeats a phrase/ question several times, in NAD  HENT:  Head: Normocephalic.   Mouth/Throat: Oropharynx is clear and moist.  Ear: left ear has suture and dressing clean Eyes: Conjunctivae are normal. Pupils are equal, round, and reactive to light.  Neck: Normal range of motion. Neck supple.  Cardiovascular: Normal rate and regular rhythm.   Pulmonary/Chest:  Effort normal and breath sounds normal.  Abdominal:  Soft, non tender, normal bowel sounds Musculoskeletal: He exhibits no edema.  Propels his wheelchiar, contractures in both knees, hands and elbows but able to move all 4  Neurological: He is alert.  Skin: Skin is warm and dry.  Psychiatric: He has a normal mood and affect.  Poor insight, ocassionally has mood swings and shows compulsive behavior   Labs reviewed 10/17/12 na 141, k 4, bun 28, cr 1.1, glu 99, ca 9 10/13/12 wbc 5.6, hb 11.1, hct 35.4, mcv 99.4, plt 169  Assessment/Plan  Left ear laceration- s/p suture, complete course of keflex, will need sutrue removal in few days, monitor for signs of infection  Anxiety- currently stable on xanax  HTN- bp remains stable, continue amlodipine and labetalol, monitor bp reading and renal function  Vit d def- continue vit d supplement  Depression- continue cymbalta and lamictal with remeron, weight stable. Mood remains stable  alzhimer's dementia- continue namenda and donepezil, no behavior changes, monitor clinically, fall precautions  BPH- tolerating flomax well. Continue this  gerd- continue ppi, monitor  Insomnia- continue melatonin for now  Hypokalemia- continue kcl supplement, monitor bmp  BPH- continue flomax for now  b12 def- continue b12 supplement

## 2013-01-12 ENCOUNTER — Non-Acute Institutional Stay (SKILLED_NURSING_FACILITY): Payer: PRIVATE HEALTH INSURANCE | Admitting: Internal Medicine

## 2013-01-12 DIAGNOSIS — I10 Essential (primary) hypertension: Secondary | ICD-10-CM

## 2013-01-12 DIAGNOSIS — F329 Major depressive disorder, single episode, unspecified: Secondary | ICD-10-CM

## 2013-01-12 DIAGNOSIS — F0280 Dementia in other diseases classified elsewhere without behavioral disturbance: Secondary | ICD-10-CM

## 2013-01-12 NOTE — Progress Notes (Signed)
Patient ID: Jake Armstrong, male   DOB: Feb 09, 1923, 79 y.o.   MRN: [id number]  [facility]  Code status- DNR  Chief complaint- medical management of chronic illness  Allergies reviewed  HPI 79 y/o male patient is a long term care resident seen for routine follow up. No recent falls reported.no recent ileus reported. He is at his baseline. He has dementia and confusion at baseline  Review of Systems: obtained from nursing staff No fever or chills No chest pain or palpitations or dyspnea No headache wears corrective lenses Has dementia and propels in wheelchair Appetite is good No new skin concern No behavioral changes  Past Medical History  Diagnosis Date  . Anxiety   . Depression   . GERD (gastroesophageal reflux disease)   . Hypertension   . Cancer     prostate  . Arthritis     osteo  . Anemia   . Parkinson's disease   . Alzheimer's disease   . Hypertrophy of prostate without urinary obstruction and other lower urinary tract symptoms (LUTS)   . Generalized osteoarthrosis, involving multiple sites   . Esophageal reflux   . Other specified anemias   . Unspecified vitamin D deficiency   . Peripheral vascular disease, unspecified   . Paralysis agitans   . Unspecified protein-calorie malnutrition   . Chronic kidney disease, stage II (mild)   . Paroxysmal tachycardia, unspecified   . Personal history of malignant neoplasm of prostate    Past Surgical History  Procedure Laterality Date  . Total knee arthroplasty Bilateral   . Rotator cuff repair    . Hernia repair      INGUINAL   Medication reviewed. See [facility]  Physical exam  Nursing note and vital signs reviewed  Constitutional:  Elderly, frail, baseline confusion, repeats a phrase/ question several times, in NAD  HENT:  Head: Normocephalic.   Mouth/Throat: Oropharynx is clear and moist.   Ear: left ear has suture and dressing clean Eyes: Conjunctivae are normal. Pupils are equal, round, and  reactive to light.   Neck: Normal range of motion. Neck supple.   Cardiovascular: Normal rate and regular rhythm.    Pulmonary/Chest: Effort normal and breath sounds normal.   Abdominal:  Soft, non tender, normal bowel sounds Musculoskeletal: He exhibits no edema.  Propels his wheelchiar, contractures in both knees, hands and elbows but able to move all 4  Neurological: He is alert.   Skin: Skin is warm and dry.  Psychiatric: He has a normal mood and affect.  Poor insight, ocassionally has mood swings and shows compulsive behavior   Labs reviewed 10/17/12 na 141, k 4, bun 28, cr 1.1, glu 99, ca 9 10/13/12 wbc 5.6, hb 11.1, hct 35.4, mcv 99.4, plt 169  Assessment/Plan  HTN- bp remains stable, continue amlodipine and labetalol, monitor bp reading and renal function  Anxiety- currently stable on xanax, continue this and monitor  Vit d def- continue vit d supplement  alzhimer's dementia- continue namenda and donepezil, no behavior changes, monitor clinically, fall precautions  Depression- continue cymbalta and lamictal with remeron, weight stable. Mood remains stable

## 2013-01-20 ENCOUNTER — Other Ambulatory Visit: Payer: Self-pay | Admitting: *Deleted

## 2013-01-20 MED ORDER — ALPRAZOLAM 0.5 MG PO TABS: ORAL_TABLET | ORAL | Status: DC

## 2013-01-20 NOTE — Telephone Encounter (Signed)
rx filled per protocol  

## 2013-02-07 ENCOUNTER — Encounter (HOSPITAL_COMMUNITY): Payer: Self-pay | Admitting: Emergency Medicine

## 2013-02-07 ENCOUNTER — Inpatient Hospital Stay (HOSPITAL_COMMUNITY)
Admission: EM | Admit: 2013-02-07 | Discharge: 2013-02-13 | DRG: 871 | Disposition: E | Payer: Medicare Other | Attending: Internal Medicine | Admitting: Internal Medicine

## 2013-02-07 ENCOUNTER — Emergency Department (HOSPITAL_COMMUNITY): Payer: Medicare Other

## 2013-02-07 DIAGNOSIS — M159 Polyosteoarthritis, unspecified: Secondary | ICD-10-CM | POA: Diagnosis present

## 2013-02-07 DIAGNOSIS — Z8546 Personal history of malignant neoplasm of prostate: Secondary | ICD-10-CM

## 2013-02-07 DIAGNOSIS — R0682 Tachypnea, not elsewhere classified: Secondary | ICD-10-CM | POA: Diagnosis present

## 2013-02-07 DIAGNOSIS — R52 Pain, unspecified: Secondary | ICD-10-CM | POA: Diagnosis present

## 2013-02-07 DIAGNOSIS — I498 Other specified cardiac arrhythmias: Secondary | ICD-10-CM | POA: Diagnosis present

## 2013-02-07 DIAGNOSIS — K219 Gastro-esophageal reflux disease without esophagitis: Secondary | ICD-10-CM | POA: Diagnosis present

## 2013-02-07 DIAGNOSIS — E46 Unspecified protein-calorie malnutrition: Secondary | ICD-10-CM | POA: Diagnosis present

## 2013-02-07 DIAGNOSIS — A419 Sepsis, unspecified organism: Principal | ICD-10-CM | POA: Diagnosis present

## 2013-02-07 DIAGNOSIS — R141 Gas pain: Secondary | ICD-10-CM | POA: Diagnosis present

## 2013-02-07 DIAGNOSIS — F3289 Other specified depressive episodes: Secondary | ICD-10-CM | POA: Diagnosis present

## 2013-02-07 DIAGNOSIS — R198 Other specified symptoms and signs involving the digestive system and abdomen: Secondary | ICD-10-CM

## 2013-02-07 DIAGNOSIS — K659 Peritonitis, unspecified: Secondary | ICD-10-CM

## 2013-02-07 DIAGNOSIS — G20A1 Parkinson's disease without dyskinesia, without mention of fluctuations: Secondary | ICD-10-CM | POA: Diagnosis present

## 2013-02-07 DIAGNOSIS — Z96659 Presence of unspecified artificial knee joint: Secondary | ICD-10-CM

## 2013-02-07 DIAGNOSIS — R69 Illness, unspecified: Secondary | ICD-10-CM

## 2013-02-07 DIAGNOSIS — K658 Other peritonitis: Secondary | ICD-10-CM | POA: Diagnosis present

## 2013-02-07 DIAGNOSIS — F0280 Dementia in other diseases classified elsewhere without behavioral disturbance: Secondary | ICD-10-CM | POA: Diagnosis present

## 2013-02-07 DIAGNOSIS — F411 Generalized anxiety disorder: Secondary | ICD-10-CM | POA: Diagnosis present

## 2013-02-07 DIAGNOSIS — E559 Vitamin D deficiency, unspecified: Secondary | ICD-10-CM | POA: Diagnosis present

## 2013-02-07 DIAGNOSIS — Z515 Encounter for palliative care: Secondary | ICD-10-CM

## 2013-02-07 DIAGNOSIS — R109 Unspecified abdominal pain: Secondary | ICD-10-CM | POA: Diagnosis present

## 2013-02-07 DIAGNOSIS — I739 Peripheral vascular disease, unspecified: Secondary | ICD-10-CM | POA: Diagnosis present

## 2013-02-07 DIAGNOSIS — I129 Hypertensive chronic kidney disease with stage 1 through stage 4 chronic kidney disease, or unspecified chronic kidney disease: Secondary | ICD-10-CM | POA: Diagnosis present

## 2013-02-07 DIAGNOSIS — R0902 Hypoxemia: Secondary | ICD-10-CM | POA: Diagnosis present

## 2013-02-07 DIAGNOSIS — N182 Chronic kidney disease, stage 2 (mild): Secondary | ICD-10-CM | POA: Diagnosis present

## 2013-02-07 DIAGNOSIS — G20 Parkinson's disease: Secondary | ICD-10-CM | POA: Diagnosis present

## 2013-02-07 DIAGNOSIS — G309 Alzheimer's disease, unspecified: Secondary | ICD-10-CM | POA: Diagnosis present

## 2013-02-07 DIAGNOSIS — N400 Enlarged prostate without lower urinary tract symptoms: Secondary | ICD-10-CM | POA: Diagnosis present

## 2013-02-07 DIAGNOSIS — F329 Major depressive disorder, single episode, unspecified: Secondary | ICD-10-CM | POA: Diagnosis present

## 2013-02-07 DIAGNOSIS — R142 Eructation: Secondary | ICD-10-CM | POA: Diagnosis present

## 2013-02-07 MED ORDER — SODIUM CHLORIDE 0.9 % IV SOLN: INTRAVENOUS | Status: DC

## 2013-02-07 MED ORDER — MORPHINE SULFATE 4 MG/ML IJ SOLN: 4.0000 mg | Freq: Once | INTRAMUSCULAR | Status: DC

## 2013-02-07 MED ORDER — LORAZEPAM 2 MG/ML IJ SOLN
1.0000 mg | Freq: Once | INTRAMUSCULAR | Status: AC
  Administered 2013-02-07: 1 mg via INTRAVENOUS
  Filled 2013-02-07: qty 1

## 2013-02-07 MED ORDER — SODIUM CHLORIDE 0.9 % IV BOLUS (SEPSIS)
1000.0000 mL | Freq: Once | INTRAVENOUS | Status: AC
  Administered 2013-02-07: 1000 mL via INTRAVENOUS

## 2013-02-07 MED ORDER — PIPERACILLIN-TAZOBACTAM 3.375 G IVPB 30 MIN
3.3750 g | Freq: Once | INTRAVENOUS | Status: DC
  Administered 2013-02-07: 3.375 g via INTRAVENOUS
  Filled 2013-02-07 (×2): qty 50

## 2013-02-07 MED ORDER — MORPHINE SULFATE 4 MG/ML IJ SOLN
4.0000 mg | Freq: Once | INTRAMUSCULAR | Status: AC
  Administered 2013-02-07: 4 mg via INTRAVENOUS
  Filled 2013-02-07: qty 1

## 2013-02-07 MED ORDER — LIP MEDEX EX OINT
TOPICAL_OINTMENT | Freq: Once | CUTANEOUS | Status: AC
  Administered 2013-02-07: 1 via TOPICAL
  Filled 2013-02-07: qty 7

## 2013-02-07 NOTE — ED Notes (Signed)
Brought in by EMS from [facility] with c/o abdominal pain.  Per EMS, staff at the facility reported that pt started complaining of abdominal pain without nausea or vomiting tonight; pt has significant abdominal distention; staff at the facility unsure on pt's date of last BM.

## 2013-02-07 NOTE — H&P (Addendum)
[facility] History and Physical  Yassin Scales NWG:[id number] DOB: March 06, 1923 DOA: 01/30/2013  Referring physician: Dr Juleen China PCP: Oneal Grout, MD   Chief complaint Sent from [facility] for acute abdominal pain and distention  HPI:  79 year old male with history of Alzheimer's dementia, anemia, Parkinson's disease, depression, chronic kidney disease stage II, history of intermittent ileus [facility] for acute onset of abdominal distention with pain. History provided by niece at bedside. Patient's nurse at the skilled nursing facility for patient to be in acute pain with abdominal distention. Patient was seen by the nurse practitioner and sent patient to the ED. No history of fever, chills, nausea, vomiting, chest pain, palpitations, shortness of breath, diarrhea, constipation, dysuria.  Course in the ED Patient was found to have severe abdominal distention with tenderness. He was then noted to be borderline hypotensive, bradycardic 40s, tachypneic and hypoxic with O2 sat dropped to 80s. An x-ray of the abdomen was done which showed diffuse free air suggestive of diffuse peritonitis. ED physician discussed poor prognosis of the patient with family   and they requested pneumonia which measures and wanted to make him for comfort. Patient given IV morphine and IV Ativan and started on IV fluids.  Hospitalists called for admission for comfort care.  Review of Systems:  As outlined in history of present illness  Past Medical History  Diagnosis Date  . Anxiety   . Depression   . GERD (gastroesophageal reflux disease)   . Hypertension   . Arthritis     osteo  . Anemia   . Parkinson's disease   . Alzheimer's disease   . Hypertrophy of prostate without urinary obstruction and other lower urinary tract symptoms (LUTS)   . Generalized osteoarthrosis, involving multiple sites   . Esophageal reflux   . Other specified anemias   . Unspecified vitamin D deficiency   .  Peripheral vascular disease, unspecified   . Paralysis agitans   . Unspecified protein-calorie malnutrition   . Paroxysmal tachycardia, unspecified   . Personal history of malignant neoplasm of prostate   . Cancer     prostate  . Chronic kidney disease, stage II (mild)    Past Surgical History  Procedure Laterality Date  . Total knee arthroplasty Bilateral   . Rotator cuff repair    . Hernia repair      INGUINAL   Social History:  reports that he has never smoked. He does not have any smokeless tobacco history on file. His alcohol and drug histories are not on file.  Allergies  Allergen Reactions  . Finasteride   . Hytrin [Terazosin]   . Tagamet [Cimetidine]     History reviewed. No pertinent family history.  Prior to Admission medications   Medication Sig Start Date End Date Taking? Authorizing Provider  acetaminophen (TYLENOL) 325 MG tablet Take 650 mg by mouth every 8 (eight) hours.   Yes Historical Provider, MD  ALPRAZolam Prudy Feeler) 0.5 MG tablet Take 0.5 mg by mouth 3 (three) times daily.   Yes Historical Provider, MD  amLODipine (NORVASC) 10 MG tablet Take 10 mg by mouth daily.   Yes Historical Provider, MD  Cholecalciferol (VITAMIN D3) 2000 UNITS TABS Take 1 capsule by mouth daily.   Yes Historical Provider, MD  donepezil (ARICEPT) 10 MG tablet Take 10 mg by mouth at bedtime.   Yes Historical Provider, MD  DULoxetine (CYMBALTA) 60 MG capsule Take 60 mg by mouth daily.   Yes Historical Provider, MD  geriatric multivitamins-minerals (ELDERTONIC/GEVRABON) Frederich Cha Take  15 mLs by mouth daily. Appetite stimulant   Yes Historical Provider, MD  labetalol (NORMODYNE) 100 MG tablet Take 100 mg by mouth 2 (two) times daily.   Yes Historical Provider, MD  lamoTRIgine (LAMICTAL) 100 MG tablet Take 100 mg by mouth 2 (two) times daily.   Yes Historical Provider, MD  lidocaine (LIDODERM) 5 % Place 1 patch onto the skin daily. Remove & Discard patch within 12 hours or as directed by MD   Yes  Historical Provider, MD  Melatonin 3 MG TABS Take 1 tablet by mouth daily.   Yes Historical Provider, MD  memantine (NAMENDA) 10 MG tablet Take 10 mg by mouth 2 (two) times daily.   Yes Historical Provider, MD  mirtazapine (REMERON) 7.5 MG tablet Take 7.5 mg by mouth at bedtime.   Yes Historical Provider, MD  omeprazole (PRILOSEC) 40 MG capsule Take 40 mg by mouth daily.   Yes Historical Provider, MD  polyethylene glycol (MIRALAX / GLYCOLAX) packet Take 17 g by mouth daily.   Yes Historical Provider, MD  Potassium Chloride (KCL-20 PO) Take 1 capsule by mouth daily.   Yes Historical Provider, MD  Propylene Glycol (SYSTANE BALANCE) 0.6 % SOLN Apply 1 drop to eye 2 (two) times daily.   Yes Historical Provider, MD  sennosides-docusate sodium (SENOKOT-S) 8.6-50 MG tablet Take 2 tablets by mouth daily.   Yes Historical Provider, MD  tamsulosin (FLOMAX) 0.4 MG CAPS Take 0.4 mg by mouth daily.   Yes Historical Provider, MD  vitamin B-12 (CYANOCOBALAMIN) 1000 MCG tablet Take 1,000 mcg by mouth daily.   Yes Historical Provider, MD    Physical Exam:  Filed Vitals:   February 23, 2013 2210 23-Feb-2013 2211  BP: 102/51   Pulse: 42   Temp: 97.6 F (36.4 C)   TempSrc: Rectal   Resp: 29   SpO2: 84% 94%    Constitutional: Vital signs reviewed. Patient is an elderly male lying in bed with rapid  shallow breathing and not responding to commands. HEENT: No pallor, dry oral mucosa Cardiovascular S1 and S2 bradycardic, no murmurs rub or gallop Chest: Tachypneic, Clear to auscultation bilaterally, no added sounds Pulmonary/Chest: CTAB, no wheezes, rales, or rhonchi Abdominal: Markedly distended , nontender to palpation, no bowel sounds  70: Warm, no edema CNS: AAO x0 Labs on Admission:  Basic Metabolic Panel: No results found for this basename: NA, K, CL, CO2, GLUCOSE, BUN, CREATININE, CALCIUM, MG, PHOS,  in the last 168 hours Liver Function Tests: No results found for this basename: AST, ALT, ALKPHOS, BILITOT,  PROT, ALBUMIN,  in the last 168 hours No results found for this basename: LIPASE, AMYLASE,  in the last 168 hours No results found for this basename: AMMONIA,  in the last 168 hours CBC: No results found for this basename: WBC, NEUTROABS, HGB, HCT, MCV, PLT,  in the last 168 hours Cardiac Enzymes: No results found for this basename: CKTOTAL, CKMB, CKMBINDEX, TROPONINI,  in the last 168 hours BNP: No components found with this basename: POCBNP,  CBG: No results found for this basename: GLUCAP,  in the last 168 hours  Radiological Exams on Admission: Dg Abd Portable 1v  02-23-13   CLINICAL DATA:  With evaluate for free air.  EXAM: PORTABLE ABDOMEN - 1 VIEW  COMPARISON:  None.  FINDINGS: A single portable decubitus view is obtained. There is a large amount of free air in the abdomen. Stool-filled colon is incompletely evaluated.  IMPRESSION: Limited study. Large amount of free air is demonstrated in the  abdomen.  Results were telephoned to Dr. Juleen China at 1251 hr on 01/23/2013.   Electronically Signed   By: Burman Nieves M.D.   On: 02/03/2013 22:53      Assessment/Plan  Principal Problem:   Septic shock with perforated viscus Patient likely has diffuse peritonitis. She does bradycardic and tachypnea with borderline blood pressure. He is hypoxic as well. As per discussion with his nieces at bedside who are  his health care power of attorney, did not want any aggressive or heroic measures for him. They request him to be made comfort care. -Keep n.p.o. Oral care per nursing. -Continue gentle IV hydration. Morphine 2 mg IV q2h for pain, shortness of breath and comfort. Will switch to continuous drip if needed -Ativan when necessary for anxiety -O2 via nasal cannula. -Discontinue all home medications.  Patient unlikely to survive this hospitalization. If clinically improved or stable over next 24-48 hours we'll look into residential hospice.   Code Status: Comfort care Family  Communication: Nieces at bedside Disposition Plan: inpatient.   Eddie North [facility] Pager [phone]  If 7PM-7AM, please contact night-coverage [email or web address] Password [facility] 01/16/2013, 11:55 PM  Total time spent: 70 minutes

## 2013-02-07 NOTE — ED Provider Notes (Signed)
CSN: [id number]     Arrival date & time 2013/02/19  2155 History   First MD Initiated Contact with Patient 19-Feb-2013 2158     Chief Complaint  Patient presents with  . Abdominal Pain   (Consider location/radiation/quality/duration/timing/severity/associated sxs/prior Treatment) HPI  89yM brought in by EMS with abdominal pain. Pt with severe dementia and unable to provide much useful history. Does complain of abdominal pain though and asks not to touch him when you try to palpate his abdomen. Family at bedside shortly after arrival to ED. Apparently pt has had intermittent ileus over past several weeks. C/o of some abdominal pain and has been distended. This has improved with laxatives though. Tonight acutely worse. He seems much more uncomfortable and distended than he has been recently.   Past Medical History  Diagnosis Date  . Anxiety   . Depression   . GERD (gastroesophageal reflux disease)   . Hypertension   . Arthritis     osteo  . Anemia   . Parkinson's disease   . Alzheimer's disease   . Hypertrophy of prostate without urinary obstruction and other lower urinary tract symptoms (LUTS)   . Generalized osteoarthrosis, involving multiple sites   . Esophageal reflux   . Other specified anemias   . Unspecified vitamin D deficiency   . Peripheral vascular disease, unspecified   . Paralysis agitans   . Unspecified protein-calorie malnutrition   . Paroxysmal tachycardia, unspecified   . Personal history of malignant neoplasm of prostate   . Cancer     prostate  . Chronic kidney disease, stage II (mild)    Past Surgical History  Procedure Laterality Date  . Total knee arthroplasty Bilateral   . Rotator cuff repair    . Hernia repair      INGUINAL   History reviewed. No pertinent family history. History  Substance Use Topics  . Smoking status: Never Smoker   . Smokeless tobacco: Not on file  . Alcohol Use: Not on file    Review of Systems  .Level 5 caveat applies  because pt has advanced dementia.   Allergies  Finasteride; Hytrin; and Tagamet  Home Medications   Current Outpatient Rx  Name  Route  Sig  Dispense  Refill  . acetaminophen (TYLENOL) 325 MG tablet   Oral   Take 650 mg by mouth every 8 (eight) hours.         . ALPRAZolam (XANAX) 0.5 MG tablet   Oral   Take 0.5 mg by mouth 3 (three) times daily.         Marland Kitchen amLODipine (NORVASC) 10 MG tablet   Oral   Take 10 mg by mouth daily.         . Cholecalciferol (VITAMIN D3) 2000 UNITS TABS   Oral   Take 1 capsule by mouth daily.         Marland Kitchen donepezil (ARICEPT) 10 MG tablet   Oral   Take 10 mg by mouth at bedtime.         . DULoxetine (CYMBALTA) 60 MG capsule   Oral   Take 60 mg by mouth daily.         Marland Kitchen geriatric multivitamins-minerals (ELDERTONIC/GEVRABON) ELIX   Oral   Take 15 mLs by mouth daily. Appetite stimulant         . labetalol (NORMODYNE) 100 MG tablet   Oral   Take 100 mg by mouth 2 (two) times daily.         Marland Kitchen lamoTRIgine (LAMICTAL)  100 MG tablet   Oral   Take 100 mg by mouth 2 (two) times daily.         Marland Kitchen lidocaine (LIDODERM) 5 %   Transdermal   Place 1 patch onto the skin daily. Remove & Discard patch within 12 hours or as directed by MD         . Melatonin 3 MG TABS   Oral   Take 1 tablet by mouth daily.         . memantine (NAMENDA) 10 MG tablet   Oral   Take 10 mg by mouth 2 (two) times daily.         . mirtazapine (REMERON) 7.5 MG tablet   Oral   Take 7.5 mg by mouth at bedtime.         Marland Kitchen omeprazole (PRILOSEC) 40 MG capsule   Oral   Take 40 mg by mouth daily.         . polyethylene glycol (MIRALAX / GLYCOLAX) packet   Oral   Take 17 g by mouth daily.         . Potassium Chloride (KCL-20 PO)   Oral   Take 1 capsule by mouth daily.         Marland Kitchen Propylene Glycol (SYSTANE BALANCE) 0.6 % SOLN   Ophthalmic   Apply 1 drop to eye 2 (two) times daily.         . sennosides-docusate sodium (SENOKOT-S) 8.6-50 MG  tablet   Oral   Take 2 tablets by mouth daily.         . tamsulosin (FLOMAX) 0.4 MG CAPS   Oral   Take 0.4 mg by mouth daily.         . vitamin B-12 (CYANOCOBALAMIN) 1000 MCG tablet   Oral   Take 1,000 mcg by mouth daily.          BP 102/51  Pulse 42  Temp(Src) 97.6 F (36.4 C) (Rectal)  Resp 29  SpO2 94% Physical Exam  Nursing note and vitals reviewed. Constitutional: He appears well-developed. He appears distressed.  HENT:  Head: Normocephalic and atraumatic.  Eyes: Conjunctivae are normal. Pupils are equal, round, and reactive to light. Right eye exhibits no discharge. Left eye exhibits no discharge.  Neck: Neck supple.  Cardiovascular: Regular rhythm and normal heart sounds.  Exam reveals no gallop and no friction rub.   No murmur heard. Mild bradycardia  Pulmonary/Chest: He is in respiratory distress.  tachypneic around 30  Abdominal: He exhibits distension. There is tenderness. There is guarding.  Abdomen markedly distended. Rigid. Diffusely tender. Tympany.   Genitourinary:  Incontinent of brown stool  Musculoskeletal: He exhibits no edema and no tenderness.  Neurological:  Confused.   Skin: Skin is dry.  Extremities cool. Skin mottled.   Psychiatric: Thought content normal.    ED Course  Procedures (including critical care time)  CRITICAL CARE Performed by: Raeford Razor  Total critical care time: 35 minutes  Critical care time was exclusive of separately billable procedures and treating other patients. Critical care was necessary to treat or prevent imminent or life-threatening deterioration. Critical care was time spent personally by me on the following activities: development of treatment plan with patient and/or surrogate as well as nursing, discussions with consultants, evaluation of patient's response to treatment, examination of patient, obtaining history from patient or surrogate, ordering and performing treatments and interventions,  ordering and review of laboratory studies, ordering and review of radiographic studies, pulse oximetry and re-evaluation of  patient's condition.   Labs Review Labs Reviewed - No data to display Imaging Review Dg Abd Portable 1v  03/02/2013   CLINICAL DATA:  With evaluate for free air.  EXAM: PORTABLE ABDOMEN - 1 VIEW  COMPARISON:  None.  FINDINGS: A single portable decubitus view is obtained. There is a large amount of free air in the abdomen. Stool-filled colon is incompletely evaluated.  IMPRESSION: Limited study. Large amount of free air is demonstrated in the abdomen.  Results were telephoned to Dr. Juleen China at 1251 hr on Mar 02, 2013.   Electronically Signed   By: Burman Nieves M.D.   On: Mar 02, 2013 22:53    EKG Interpretation   None       MDM   1. Perforated viscus   2. Peritonitis    Pt exams like a perf. Went to radiology dept to speak with techs to expedite imaging. IVx2. Empiric abx. Labs. IVF.  XR with large amount of free air unfortunately. Family now at bedside. Reasonable in their expectations. Discussed gravity of situation and extremely poor prognosis even with surgery. Understand that without surgery that he will die. Would like pt to be comfort care. Will discuss with medicine for palliative care admission.     Raeford Razor, MD 02-Mar-2013 [phone]

## 2013-02-07 NOTE — ED Notes (Signed)
Received EDP's verbal order to d/c all lab works---- lab was notified.

## 2013-02-07 NOTE — ED Notes (Signed)
Bed: ZO10 Expected date: 02/11/2013 Expected time: 9:41 PM Means of arrival: Ambulance Comments: abd pain

## 2013-02-08 DIAGNOSIS — K659 Peritonitis, unspecified: Secondary | ICD-10-CM

## 2013-02-13 NOTE — Progress Notes (Signed)
Pt transferred to floor.  Vitals taken.  Pt breathing is agonal, eyes are open but non-responsive.  RN invited family into room. Upon re-entering family stated "I think he has passed".  2 RN's confirmed no heartbeat can be palpated nor auscultated at 0048.

## 2013-02-13 NOTE — Discharge Summary (Signed)
Physician Discharge Summary  Jake Armstrong RUE:[id number] DOB: 04-25-1922 DOA: 02-25-13  PCP: Kimber Relic, MD  Admit date: 25-Feb-2013 Discharge date: 01/27/2013  Time spent: 25 minutes    Discharge Diagnoses:  Principal Problem:   Septic shock  Active Problems:   Alzheimer's disease   Perforated viscus   Discharge Condition:  Patient expired   There were no vitals filed for this visit.  History of present illness:  79 year old male with history of Alzheimer's dementia, anemia, Parkinson's disease, depression, chronic kidney disease stage II, history of intermittent ileus centrum [facility] for acute onset of abdominal distention with pain. History provided by niece at bedside. Patient's nurse at the skilled nursing facility for patient to be in acute pain with abdominal distention. Patient was seen by the nurse practitioner and sent patient to the ED. No history of fever, chills, nausea, vomiting, chest pain, palpitations, shortness of breath, diarrhea, constipation, dysuria.  Patient was found to have severe abdominal distention with tenderness. He was then noted to be borderline hypotensive, bradycardic 40s, tachypneic and hypoxic with O2 sat dropped to 80s. An x-ray of the abdomen was done which showed diffuse free air suggestive of diffuse peritonitis.  ED physician discussed poor prognosis of the patient with family and they requested pneumonia which measures and wanted to make him for comfort. Patient given IV morphine and IV Ativan and started on IV fluids.   Hospital Course:  Patient presented with septic shock due to perforated viscus from peritonitis. He was  bradycardic and tachypnea with borderline blood pressure. He was hypoxic as well. As per discussion with his nieces at bedside who are his health care power of attorney, they did not want any aggressive or heroic measures for him. They requested him to be made comfort care.  -given gentle IV hydration. Morphine  2 mg IV q2h for pain for  shortness of breath and comfort. Ativan added for anxiety. Patient was poorly responsive with massive abdominal distention on my evalaution -Discontinued all home medications.  -patent expired shortly after arriving to medical floor.  Nieces were  present when patient pronounced dead.    Procedures:  none  Consultations:  none  Discharge Exam: Filed Vitals:   01/20/2013 0028  BP: 83/45  Pulse: 35  Temp: 94.2 F (34.6 C)  Resp: 30       Allergies  Allergen Reactions  . Finasteride   . Hytrin [Terazosin]   . Tagamet [Cimetidine]       The results of significant diagnostics from this hospitalization (including imaging, microbiology, ancillary and laboratory) are listed below for reference.    Significant Diagnostic Studies: Dg Abd Portable 1v  25-Feb-2013   CLINICAL DATA:  With evaluate for free air.  EXAM: PORTABLE ABDOMEN - 1 VIEW  COMPARISON:  None.  FINDINGS: A single portable decubitus view is obtained. There is a large amount of free air in the abdomen. Stool-filled colon is incompletely evaluated.  IMPRESSION: Limited study. Large amount of free air is demonstrated in the abdomen.  Results were telephoned to Dr. Juleen China at 1251 hr on February 25, 2013.   Electronically Signed   By: Burman Nieves M.D.   On: 02-25-2013 22:53    Microbiology: No results found for this or any previous visit (from the past 240 hour(s)).   Labs: Basic Metabolic Panel: No results found for this basename: NA, K, CL, CO2, GLUCOSE, BUN, CREATININE, CALCIUM, MG, PHOS,  in the last 168 hours Liver Function Tests: No results found for this basename:  AST, ALT, ALKPHOS, BILITOT, PROT, ALBUMIN,  in the last 168 hours No results found for this basename: LIPASE, AMYLASE,  in the last 168 hours No results found for this basename: AMMONIA,  in the last 168 hours CBC: No results found for this basename: WBC, NEUTROABS, HGB, HCT, MCV, PLT,  in the last 168 hours Cardiac  Enzymes: No results found for this basename: CKTOTAL, CKMB, CKMBINDEX, TROPONINI,  in the last 168 hours BNP: BNP (last 3 results) No results found for this basename: PROBNP,  in the last 8760 hours CBG: No results found for this basename: GLUCAP,  in the last 168 hours     Signed:  Ellard Nan  [facility] 02/04/2013, 1:35 AM

## 2013-02-13 DEATH — deceased
